# Patient Record
Sex: Female | Born: 1975 | Race: Black or African American | Hispanic: No | Marital: Single | Smoking: Current every day smoker
Health system: Southern US, Community
[De-identification: ages and names within clinical notes are randomized; demographics above are authoritative.]

## PROBLEM LIST (undated history)

## (undated) DIAGNOSIS — J45909 Unspecified asthma, uncomplicated: Secondary | ICD-10-CM

## (undated) HISTORY — PX: CHOLECYSTECTOMY: SHX55

---

## 2014-07-05 ENCOUNTER — Emergency Department: Payer: Self-pay | Admitting: Emergency Medicine

## 2014-07-05 LAB — COMPREHENSIVE METABOLIC PANEL
ALK PHOS: 66 U/L
Albumin: 3.8 g/dL (ref 3.4–5.0)
Anion Gap: 4 — ABNORMAL LOW (ref 7–16)
BUN: 13 mg/dL (ref 7–18)
Bilirubin,Total: <0.1 mg/dL — ABNORMAL LOW (ref 0.2–1.0)
CHLORIDE: 110 mmol/L — AB (ref 98–107)
Calcium, Total: 9.3 mg/dL (ref 8.5–10.1)
Co2: 26 mmol/L (ref 21–32)
Creatinine: 0.99 mg/dL (ref 0.60–1.30)
EGFR (Non-African Amer.): >60
Glucose: 88 mg/dL (ref 65–99)
Osmolality: 279 (ref 275–301)
Potassium: 4.1 mmol/L (ref 3.5–5.1)
SGOT(AST): 27 U/L (ref 15–37)
SGPT (ALT): 36 U/L
SODIUM: 140 mmol/L (ref 136–145)
TOTAL PROTEIN: 8.6 g/dL — AB (ref 6.4–8.2)

## 2014-07-05 LAB — URINALYSIS, COMPLETE
BILIRUBIN, UR: NEGATIVE
Blood: NEGATIVE
Glucose,UR: NEGATIVE mg/dL (ref 0–75)
Ketone: NEGATIVE
Leukocyte Esterase: NEGATIVE
NITRITE: NEGATIVE
PH: 6 (ref 4.5–8.0)
Protein: NEGATIVE
RBC,UR: 1 /HPF (ref 0–5)
Specific Gravity: 1.015 (ref 1.003–1.030)
Squamous Epithelial: 1

## 2014-07-05 LAB — PREGNANCY, URINE: Pregnancy Test, Urine: NEGATIVE m[IU]/mL

## 2014-07-05 LAB — ED INFLUENZA
H1N1FLUPCR: NOT DETECTED
INFLBPCR: NEGATIVE
Influenza A By PCR: NEGATIVE

## 2014-07-05 LAB — CBC
HCT: 42 % (ref 35.0–47.0)
HGB: 13.7 g/dL (ref 12.0–16.0)
MCH: 27.5 pg (ref 26.0–34.0)
MCHC: 32.6 g/dL (ref 32.0–36.0)
MCV: 84 fL (ref 80–100)
PLATELETS: 332 10*3/uL (ref 150–440)
RBC: 4.98 10*6/uL (ref 3.80–5.20)
RDW: 13.7 % (ref 11.5–14.5)
WBC: 9.3 10*3/uL (ref 3.6–11.0)

## 2015-02-04 ENCOUNTER — Emergency Department
Admission: EM | Admit: 2015-02-04 | Discharge: 2015-02-04 | Disposition: A | Discharge status: Home / Self Care | Payer: Worker's Compensation | Attending: Emergency Medicine | Admitting: Emergency Medicine

## 2015-02-04 ENCOUNTER — Emergency Department: Payer: Worker's Compensation

## 2015-02-04 DIAGNOSIS — W228XXA Striking against or struck by other objects, initial encounter: Secondary | ICD-10-CM | POA: Diagnosis not present

## 2015-02-04 DIAGNOSIS — Y9389 Activity, other specified: Secondary | ICD-10-CM | POA: Diagnosis not present

## 2015-02-04 DIAGNOSIS — S99912A Unspecified injury of left ankle, initial encounter: Secondary | ICD-10-CM | POA: Diagnosis present

## 2015-02-04 DIAGNOSIS — Y990 Civilian activity done for income or pay: Secondary | ICD-10-CM | POA: Insufficient documentation

## 2015-02-04 DIAGNOSIS — Y92219 Unspecified school as the place of occurrence of the external cause: Secondary | ICD-10-CM | POA: Insufficient documentation

## 2015-02-04 DIAGNOSIS — S9002XA Contusion of left ankle, initial encounter: Secondary | ICD-10-CM | POA: Diagnosis not present

## 2015-02-04 MED ORDER — KETOROLAC TROMETHAMINE 30 MG/ML IJ SOLN: 60.0000 mg | Freq: Once | INTRAMUSCULAR | Status: AC

## 2015-02-04 MED ORDER — HYDROCODONE-ACETAMINOPHEN 5-325 MG PO TABS: 1.0000 | ORAL_TABLET | ORAL | Status: DC | PRN

## 2015-02-04 MED ORDER — IBUPROFEN 800 MG PO TABS: 800.0000 mg | ORAL_TABLET | Freq: Three times a day (TID) | ORAL | Status: DC | PRN

## 2015-02-04 MED ORDER — KETOROLAC TROMETHAMINE 60 MG/2ML IM SOLN
INTRAMUSCULAR | Status: AC
  Administered 2015-02-04: 60 mg
  Filled 2015-02-04: qty 2
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
## 2015-02-04 NOTE — ED Provider Notes (Signed)
[facility] Emergency Department Provider Note  ____________________________________________  Time seen: Approximately 10:35 AM  I have reviewed the triage vital signs and the nursing notes.   HISTORY  Chief Complaint Ankle Pain    HPI Kayla Vargas is a 39 y.o. female since emergency room with complaints of left ankle pain and swelling. States that she was working at [employer] 3 days ago when a rack came out and Hit her leg. She complains initially of ankle pain and swelling however reports the swelling has gone down today but still complains of pain. Rates pain as a 10 over 10.   No past medical history on file.  There are no active problems to display for this patient.   No past surgical history on file.  Current Outpatient Rx  Name  Route  Sig  Dispense  Refill  . HYDROcodone-acetaminophen (NORCO) 5-325 MG per tablet   Oral   Take 1 tablet by mouth every 4 (four) hours as needed for moderate pain.   12 tablet   0   . ibuprofen (ADVIL,MOTRIN) 800 MG tablet   Oral   Take 1 tablet (800 mg total) by mouth every 8 (eight) hours as needed.   30 tablet   0     Allergies Review of patient's allergies indicates no known allergies.  No family history on file.  Social History History  Substance Use Topics  . Smoking status: Not on file  . Smokeless tobacco: Not on file  . Alcohol Use: Not on file    Review of Systems Constitutional: No fever/chills Cardiovascular: Denies chest pain. Respiratory: Denies shortness of breath. Musculoskeletal: Negative for back pain. Skin: Negative for rash. Neurological: Negative for headaches, focal weakness or numbness.  10-point ROS otherwise negative.  ____________________________________________   PHYSICAL EXAM:  VITAL SIGNS: ED Triage Vitals  Enc Vitals Group     BP 02/04/15 1009 113/69 mmHg     Pulse Rate 02/04/15 1009 88     Resp 02/04/15 1009 16     Temp 02/04/15 1009 99 F  (37.2 C)     Temp Source 02/04/15 1009 Oral     SpO2 02/04/15 1009 97 %     Weight 02/04/15 1009 249 lb (112.946 kg)     Height 02/04/15 1009  (1.6 m)     Head Cir --      Peak Flow --      Pain Score 02/04/15 1009 10     Pain Loc --      Pain Edu? --      Excl. in GC? --     Constitutional: Alert and oriented. Well appearing and in no acute distress.   Cardiovascular: Normal rate, regular rhythm. Grossly normal heart sounds.  Good peripheral circulation. Respiratory: Normal respiratory effort.  No retractions. Lungs CTAB. Gastrointestinal: Soft and nontender. No distention. No abdominal bruits. No CVA tenderness. Musculoskeletal: No lower extremity tenderness nor edema.  No joint effusions. Neurologic:  Normal speech and language. No gross focal neurologic deficits are appreciated. Speech is normal. Gait not tested due to pain. Skin:  Skin is warm, dry and intact. No rash noted. Psychiatric: Mood and affect are normal. Speech and behavior are normal.  ____________________________________________   LABS (all labs ordered are listed, but only abnormal results are displayed)  Labs Reviewed - No data to display ____________________________________________  EKG not applicable ____________________________________________  RADIOLOGY Interpreted by radiologist, reviewed by myself. Negative for fracture. ____________________________________________   PROCEDURES  Procedure(s) performed:  None  Critical Care performed: No  ____________________________________________   INITIAL IMPRESSION / ASSESSMENT AND PLAN / ED COURSE  Pertinent labs & imaging results that were available during my care of the patient were reviewed by me and considered in my medical decision making (see chart for details).  Nicole Kindredgnes is acute right ankle contusion. Rx given for Motrin 800 mg 3 times a day #30, follow up with PCP as needed. ____________________________________________   FINAL CLINICAL  IMPRESSION(S) / ED DIAGNOSES  Final diagnoses:  Ankle contusion, left, initial encounter      Evangeline DakinCharles M Beers, PA-C 02/04/15 1130  Jene Everyobert Kinner, MD 02/04/15 [phone]

## 2015-02-04 NOTE — Discharge Instructions (Signed)
Contusion °A contusion is a deep bruise. Contusions are the result of an injury that caused bleeding under the skin. The contusion may turn blue, purple, or yellow. Minor injuries will give you a painless contusion, but more severe contusions may stay painful and swollen for a few weeks.  °CAUSES  °A contusion is usually caused by a blow, trauma, or direct force to an area of the body. °SYMPTOMS  °· Swelling and redness of the injured area. °· Bruising of the injured area. °· Tenderness and soreness of the injured area. °· Pain. °DIAGNOSIS  °The diagnosis can be made by taking a history and physical exam. An X-ray, CT scan, or MRI may be needed to determine if there were any associated injuries, such as fractures. °TREATMENT  °Specific treatment will depend on what area of the body was injured. In general, the best treatment for a contusion is resting, icing, elevating, and applying cold compresses to the injured area. Over-the-counter medicines may also be recommended for pain control. Ask your caregiver what the best treatment is for your contusion. °HOME CARE INSTRUCTIONS  °· Put ice on the injured area. °¨ Put ice in a plastic bag. °¨ Place a towel between your skin and the bag. °¨ Leave the ice on for 15-20 minutes, 3-4 times a day, or as directed by your health care provider. °· Only take over-the-counter or prescription medicines for pain, discomfort, or fever as directed by your caregiver. Your caregiver may recommend avoiding anti-inflammatory medicines (aspirin, ibuprofen, and naproxen) for 48 hours because these medicines may increase bruising. °· Rest the injured area. °· If possible, elevate the injured area to reduce swelling. °SEEK IMMEDIATE MEDICAL CARE IF:  °· You have increased bruising or swelling. °· You have pain that is getting worse. °· Your swelling or pain is not relieved with medicines. °MAKE SURE YOU:  °· Understand these instructions. °· Will watch your condition. °· Will get help right  away if you are not doing well or get worse. °Document Released: 06/11/2005 Document Revised: 09/06/2013 Document Reviewed: 07/07/2011 °ExitCare® Patient Information ©2015 [employer]. This information is not intended to replace advice given to you by your health care provider. Make sure you discuss any questions you have with your health care provider. ° °

## 2015-07-04 ENCOUNTER — Emergency Department: Payer: Self-pay

## 2015-07-04 ENCOUNTER — Encounter: Payer: Self-pay | Admitting: *Deleted

## 2015-07-04 ENCOUNTER — Emergency Department
Admission: EM | Admit: 2015-07-04 | Discharge: 2015-07-04 | Disposition: A | Discharge status: Home / Self Care | Payer: Self-pay | Attending: Emergency Medicine | Admitting: Emergency Medicine

## 2015-07-04 DIAGNOSIS — Y92410 Unspecified street and highway as the place of occurrence of the external cause: Secondary | ICD-10-CM | POA: Insufficient documentation

## 2015-07-04 DIAGNOSIS — Y9389 Activity, other specified: Secondary | ICD-10-CM | POA: Insufficient documentation

## 2015-07-04 DIAGNOSIS — S39012A Strain of muscle, fascia and tendon of lower back, initial encounter: Secondary | ICD-10-CM | POA: Insufficient documentation

## 2015-07-04 DIAGNOSIS — Y998 Other external cause status: Secondary | ICD-10-CM | POA: Insufficient documentation

## 2015-07-04 DIAGNOSIS — S4991XA Unspecified injury of right shoulder and upper arm, initial encounter: Secondary | ICD-10-CM | POA: Insufficient documentation

## 2015-07-04 DIAGNOSIS — S0990XA Unspecified injury of head, initial encounter: Secondary | ICD-10-CM | POA: Insufficient documentation

## 2015-07-04 DIAGNOSIS — Z720 Tobacco use: Secondary | ICD-10-CM | POA: Insufficient documentation

## 2015-07-04 DIAGNOSIS — S161XXA Strain of muscle, fascia and tendon at neck level, initial encounter: Secondary | ICD-10-CM | POA: Insufficient documentation

## 2015-07-04 HISTORY — DX: Unspecified asthma, uncomplicated: J45.909

## 2015-07-04 MED ORDER — TRAMADOL HCL 50 MG PO TABS: 50.0000 mg | ORAL_TABLET | Freq: Once | ORAL | Status: DC

## 2015-07-04 MED ORDER — IBUPROFEN 600 MG PO TABS: 600.0000 mg | ORAL_TABLET | Freq: Four times a day (QID) | ORAL | Status: DC | PRN

## 2015-07-04 MED ORDER — TRAMADOL HCL 50 MG PO TABS
50.0000 mg | ORAL_TABLET | Freq: Once | ORAL | Status: AC
  Administered 2015-07-04: 50 mg via ORAL
  Filled 2015-07-04: qty 1

## 2015-07-04 MED ORDER — IBUPROFEN 600 MG PO TABS
600.0000 mg | ORAL_TABLET | Freq: Once | ORAL | Status: AC
  Administered 2015-07-04: 600 mg via ORAL
  Filled 2015-07-04: qty 1

## 2015-07-04 NOTE — ED Notes (Signed)
Patient discharged to home per MD order. Patient in stable condition, and deemed medically cleared by ED provider for discharge. Discharge instructions reviewed with patient/family using "Teach Back"; verbalized understanding of medication education and administration, and information about follow-up care. Denies further concerns. ° °
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
## 2015-07-04 NOTE — ED Notes (Signed)
Patient transported to X-ray 

## 2015-07-04 NOTE — Discharge Instructions (Signed)
Acute Torticollis °Torticollis is a condition in which the muscles of the neck tighten (contract) abnormally, causing the neck to twist and the head to move into an unnatural position. Torticollis that develops suddenly is called acute torticollis. If torticollis becomes chronic and is left untreated, the face and neck can become deformed. °CAUSES °This condition may be caused by: °· Sleeping in an awkward position (common). °· Extending or twisting the neck muscles beyond their normal position. °· Infection. °In some cases, the cause may not be known. °SYMPTOMS °Symptoms of this condition include: °· An unnatural position of the head. °· Neck pain. °· A limited ability to move the neck. °· Twisting of the neck to one side. °DIAGNOSIS °This condition is diagnosed with a physical exam. You may also have imaging tests, such as an X-ray, CT scan, or MRI. °TREATMENT °Treatment for this condition involves trying to relax the neck muscles. It may include: °· Medicines or shots. °· Physical therapy. °· Surgery. This may be done in severe cases. °HOME CARE INSTRUCTIONS °· Take medicines only as directed by your health care provider. °· Do stretching exercises and massage your neck as directed by your health care provider. °· Keep all follow-up visits as directed by your health care provider. This is important. °SEEK MEDICAL CARE IF: °· You develop a fever. °SEEK IMMEDIATE MEDICAL CARE IF: °· You develop difficulty breathing. °· You develop noisy breathing (stridor). °· You start drooling. °· You have trouble swallowing or have pain with swallowing. °· You develop numbness or weakness in your hands or feet. °· You have changes in your speech, understanding, or vision. °· Your pain gets worse. °  °This information is not intended to replace advice given to you by your health care provider. Make sure you discuss any questions you have with your health care provider. °  °Document Released: 08/29/2000 Document Revised:  01/16/2015 Document Reviewed: 08/28/2014 °Elsevier Interactive Patient Education ©2016 [employer] ° °

## 2015-07-12 ENCOUNTER — Emergency Department
Admission: EM | Admit: 2015-07-12 | Discharge: 2015-07-12 | Disposition: A | Discharge status: Home / Self Care | Payer: Self-pay | Attending: Emergency Medicine | Admitting: Emergency Medicine

## 2015-07-12 ENCOUNTER — Encounter: Payer: Self-pay | Admitting: *Deleted

## 2015-07-12 DIAGNOSIS — M62830 Muscle spasm of back: Secondary | ICD-10-CM | POA: Insufficient documentation

## 2015-07-12 DIAGNOSIS — Z720 Tobacco use: Secondary | ICD-10-CM | POA: Insufficient documentation

## 2015-07-12 DIAGNOSIS — Z79899 Other long term (current) drug therapy: Secondary | ICD-10-CM | POA: Insufficient documentation

## 2015-07-12 DIAGNOSIS — M5431 Sciatica, right side: Secondary | ICD-10-CM | POA: Insufficient documentation

## 2015-07-12 DIAGNOSIS — M5432 Sciatica, left side: Secondary | ICD-10-CM | POA: Insufficient documentation

## 2015-07-12 MED ORDER — CYCLOBENZAPRINE HCL 10 MG PO TABS: 10.0000 mg | ORAL_TABLET | Freq: Three times a day (TID) | ORAL | Status: DC | PRN

## 2015-07-12 MED ORDER — KETOROLAC TROMETHAMINE 30 MG/ML IJ SOLN
60.0000 mg | Freq: Once | INTRAMUSCULAR | Status: AC
  Administered 2015-07-12: 60 mg via INTRAMUSCULAR
  Filled 2015-07-12: qty 2

## 2015-07-12 MED ORDER — MELOXICAM 15 MG PO TABS: 15.0000 mg | ORAL_TABLET | Freq: Every day | ORAL | Status: DC
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
## 2015-07-12 NOTE — Discharge Instructions (Signed)
Back Exercises °The following exercises strengthen the muscles that help to support the back. They also help to keep the lower back flexible. Doing these exercises can help to prevent back pain or lessen existing pain. °If you have back pain or discomfort, try doing these exercises 2-3 times each day or as told by your health care provider. When the pain goes away, do them once each day, but increase the number of times that you repeat the steps for each exercise (do more repetitions). If you do not have back pain or discomfort, do these exercises once each day or as told by your health care provider. °EXERCISES °Single Knee to Chest °Repeat these steps 3-5 times for each leg: °· Lie on your back on a firm bed or the floor with your legs extended. °· Bring one knee to your chest. Your other leg should stay extended and in contact with the floor. °· Hold your knee in place by grabbing your knee or thigh. °· Pull on your knee until you feel a gentle stretch in your lower back. °· Hold the stretch for 10-30 seconds. °· Slowly release and straighten your leg. °Pelvic Tilt °Repeat these steps 5-10 times: °· Lie on your back on a firm bed or the floor with your legs extended. °· Bend your knees so they are pointing toward the ceiling and your feet are flat on the floor. °· Tighten your lower abdominal muscles to press your lower back against the floor. This motion will tilt your pelvis so your tailbone points up toward the ceiling instead of pointing to your feet or the floor. °· With gentle tension and even breathing, hold this position for 5-10 seconds. °Cat-Cow °Repeat these steps until your lower back becomes more flexible: °· Get into a hands-and-knees position on a firm surface. Keep your hands under your shoulders, and keep your knees under your hips. You may place padding under your knees for comfort. °· Let your head hang down, and point your tailbone toward the floor so your lower back becomes rounded like the  back of a cat. °· Hold this position for 5 seconds. °· Slowly lift your head and point your tailbone up toward the ceiling so your back forms a sagging arch like the back of a cow. °· Hold this position for 5 seconds. °Press-Ups °Repeat these steps 5-10 times: °· Lie on your abdomen (face-down) on the floor. °· Place your palms near your head, about shoulder-width apart. °· While you keep your back as relaxed as possible and keep your hips on the floor, slowly straighten your arms to raise the top half of your body and lift your shoulders. Do not use your back muscles to raise your upper torso. You may adjust the placement of your hands to make yourself more comfortable. °· Hold this position for 5 seconds while you keep your back relaxed. °· Slowly return to lying flat on the floor. °Bridges °Repeat these steps 10 times: °· Lie on your back on a firm surface. °· Bend your knees so they are pointing toward the ceiling and your feet are flat on the floor. °· Tighten your buttocks muscles and lift your buttocks off of the floor until your waist is at almost the same height as your knees. You should feel the muscles working in your buttocks and the back of your thighs. If you do not feel these muscles, slide your feet 1-2 inches farther away from your buttocks. °· Hold this position for 3-5   seconds.  Slowly lower your hips to the starting position, and allow your buttocks muscles to relax completely. If this exercise is too easy, try doing it with your arms crossed over your chest. Abdominal Crunches Repeat these steps 5-10 times: 1. Lie on your back on a firm bed or the floor with your legs extended. 2. Bend your knees so they are pointing toward the ceiling and your feet are flat on the floor. 3. Cross your arms over your chest. 4. Tip your chin slightly toward your chest without bending your neck. 5. Tighten your abdominal muscles and slowly raise your trunk (torso) high enough to lift your shoulder blades  a tiny bit off of the floor. Avoid raising your torso higher than that, because it can put too much stress on your low back and it does not help to strengthen your abdominal muscles. 6. Slowly return to your starting position. Back Lifts Repeat these steps 5-10 times: 1. Lie on your abdomen (face-down) with your arms at your sides, and rest your forehead on the floor. 2. Tighten the muscles in your legs and your buttocks. 3. Slowly lift your chest off of the floor while you keep your hips pressed to the floor. Keep the back of your head in line with the curve in your back. Your eyes should be looking at the floor. 4. Hold this position for 3-5 seconds. 5. Slowly return to your starting position. SEEK MEDICAL CARE IF:  Your back pain or discomfort gets much worse when you do an exercise.  Your back pain or discomfort does not lessen within 2 hours after you exercise. If you have any of these problems, stop doing these exercises right away. Do not do them again unless your health care provider says that you can. SEEK IMMEDIATE MEDICAL CARE IF:  You develop sudden, severe back pain. If this happens, stop doing the exercises right away. Do not do them again unless your health care provider says that you can.   This information is not intended to replace advice given to you by your health care provider. Make sure you discuss any questions you have with your health care provider.   Document Released: 10/09/2004 Document Revised: 05/23/2015 Document Reviewed: 10/26/2014 Elsevier Interactive Patient Education 2016 [place] therapy can help ease sore, stiff, injured, and tight muscles and joints. Heat relaxes your muscles, which may help ease your pain.  RISKS AND COMPLICATIONS If you have any of the following conditions, do not use heat therapy unless your health care provider has approved:  Poor circulation.  Healing wounds or scarred skin in the area being  treated.  Diabetes, heart disease, or high blood pressure.  Not being able to feel (numbness) the area being treated.  Unusual swelling of the area being treated.  Active infections.  Blood clots.  Cancer.  Inability to communicate pain. This may include young children and people who have problems with their brain function (dementia).  Pregnancy. Heat therapy should only be used on old, pre-existing, or long-lasting (chronic) injuries. Do not use heat therapy on new injuries unless directed by your health care provider. HOW TO USE HEAT THERAPY There are several different kinds of heat therapy, including:  Moist heat pack.  Warm water bath.  Hot water bottle.  Electric heating pad.  Heated gel pack.  Heated wrap.  Electric heating pad. Use the heat therapy method suggested by your health care provider. Follow your health care provider's instructions on when  and how to use heat therapy. GENERAL HEAT THERAPY RECOMMENDATIONS  Do not sleep while using heat therapy. Only use heat therapy while you are awake.  Your skin may turn pink while using heat therapy. Do not use heat therapy if your skin turns red.  Do not use heat therapy if you have new pain.  High heat or long exposure to heat can cause burns. Be careful when using heat therapy to avoid burning your skin.  Do not use heat therapy on areas of your skin that are already irritated, such as with a rash or sunburn. SEEK MEDICAL CARE IF:  You have blisters, redness, swelling, or numbness.  You have new pain.  Your pain is worse. MAKE SURE YOU:  Understand these instructions.  Will watch your condition.  Will get help right away if you are not doing well or get worse.   This information is not intended to replace advice given to you by your health care provider. Make sure you discuss any questions you have with your health care provider.   Document Released: 11/24/2011 Document Revised: 09/22/2014 Document  Reviewed: 10/25/2013 Elsevier Interactive Patient Education 2016 [employer] Inc[employer]  Muscle Cramps and Spasms Muscle cramps and spasms are when muscles tighten by themselves. They usually get better within minutes. Muscle cramps are painful. They are usually stronger and last longer than muscle spasms. Muscle spasms may or may not be painful. They can last a few seconds or much longer. HOME CARE  Drink enough fluid to keep your pee (urine) clear or pale yellow.  Massage, stretch, and relax the muscle.  Use a warm towel, heating pad, or warm shower water on tight muscles.  Place ice on the muscle if it is tender or in pain.  Put ice in a plastic bag.  Place a towel between your skin and the bag.  Leave the ice on for 15-20 minutes, 03-04 times a day.  Only take medicine as told by your doctor. GET HELP RIGHT AWAY IF:  Your cramps or spasms get worse, happen more often, or do not get better with time. MAKE SURE YOU:  Understand these instructions.  Will watch your condition.  Will get help right away if you are not doing well or get worse.   This information is not intended to replace advice given to you by your health care provider. Make sure you discuss any questions you have with your health care provider.   Document Released: 08/14/2008 Document Revised: 12/27/2012 Document Reviewed: 08/18/2012 Elsevier Interactive Patient Education [employer]
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
## 2015-09-25 ENCOUNTER — Emergency Department
Admission: EM | Admit: 2015-09-25 | Discharge: 2015-09-25 | Disposition: A | Discharge status: Home / Self Care | Payer: Managed Care, Other (non HMO) | Attending: Emergency Medicine | Admitting: Emergency Medicine

## 2015-09-25 ENCOUNTER — Emergency Department: Payer: Managed Care, Other (non HMO)

## 2015-09-25 DIAGNOSIS — F17210 Nicotine dependence, cigarettes, uncomplicated: Secondary | ICD-10-CM | POA: Diagnosis not present

## 2015-09-25 DIAGNOSIS — M25461 Effusion, right knee: Secondary | ICD-10-CM | POA: Diagnosis not present

## 2015-09-25 DIAGNOSIS — Y9289 Other specified places as the place of occurrence of the external cause: Secondary | ICD-10-CM | POA: Insufficient documentation

## 2015-09-25 DIAGNOSIS — S8991XA Unspecified injury of right lower leg, initial encounter: Secondary | ICD-10-CM | POA: Diagnosis present

## 2015-09-25 DIAGNOSIS — X58XXXA Exposure to other specified factors, initial encounter: Secondary | ICD-10-CM | POA: Insufficient documentation

## 2015-09-25 DIAGNOSIS — S8001XA Contusion of right knee, initial encounter: Secondary | ICD-10-CM | POA: Insufficient documentation

## 2015-09-25 DIAGNOSIS — Y998 Other external cause status: Secondary | ICD-10-CM | POA: Diagnosis not present

## 2015-09-25 DIAGNOSIS — Y9389 Activity, other specified: Secondary | ICD-10-CM | POA: Insufficient documentation

## 2015-09-25 MED ORDER — OXYCODONE-ACETAMINOPHEN 5-325 MG PO TABS: 1.0000 | ORAL_TABLET | ORAL | Status: AC | PRN

## 2015-09-25 MED ORDER — IBUPROFEN 800 MG PO TABS: 800.0000 mg | ORAL_TABLET | Freq: Three times a day (TID) | ORAL | Status: AC | PRN

## 2015-09-25 NOTE — ED Notes (Signed)
Pt states she injured her right knee playing in the snow with her kids on Saturday

## 2015-09-25 NOTE — ED Notes (Signed)
Fell on knee this weekend while playing in the the snow  conts to have pain with ambulation

## 2015-09-25 NOTE — Discharge Instructions (Signed)
Heat Therapy Heat therapy can help ease sore, stiff, injured, and tight muscles and joints. Heat relaxes your muscles, which may help ease your pain.  RISKS AND COMPLICATIONS If you have any of the following conditions, do not use heat therapy unless your health care provider has approved:  Poor circulation.  Healing wounds or scarred skin in the area being treated.  Diabetes, heart disease, or high blood pressure.  Not being able to feel (numbness) the area being treated.  Unusual swelling of the area being treated.  Active infections.  Blood clots.  Cancer.  Inability to communicate pain. This may include young children and people who have problems with their brain function (dementia).  Pregnancy. Heat therapy should only be used on old, pre-existing, or long-lasting (chronic) injuries. Do not use heat therapy on new injuries unless directed by your health care provider. HOW TO USE HEAT THERAPY There are several different kinds of heat therapy, including:  Moist heat pack.  Warm water bath.  Hot water bottle.  Electric heating pad.  Heated gel pack.  Heated wrap.  Electric heating pad. Use the heat therapy method suggested by your health care provider. Follow your health care provider's instructions on when and how to use heat therapy. GENERAL HEAT THERAPY RECOMMENDATIONS  Do not sleep while using heat therapy. Only use heat therapy while you are awake.  Your skin may turn pink while using heat therapy. Do not use heat therapy if your skin turns red.  Do not use heat therapy if you have new pain.  High heat or long exposure to heat can cause burns. Be careful when using heat therapy to avoid burning your skin.  Do not use heat therapy on areas of your skin that are already irritated, such as with a rash or sunburn. SEEK MEDICAL CARE IF:  You have blisters, redness, swelling, or numbness.  You have new pain.  Your pain is worse. MAKE SURE  YOU:  Understand these instructions.  Will watch your condition.  Will get help right away if you are not doing well or get worse.   This information is not intended to replace advice given to you by your health care provider. Make sure you discuss any questions you have with your health care provider.   Document Released: 11/24/2011 Document Revised: 09/22/2014 Document Reviewed: 10/25/2013 Elsevier Interactive Patient Education 2016 Else[employer] Inc.  Cryotherapy Cryotherapy means treatment with cold. Ice or gel packs can be used to reduce both pain and swelling. Ice is the most helpful within the first 24 to 48 hours after an injury or flare-up from overusing a muscle or joint. Sprains, strains, spasms, burning pain, shooting pain, and aches can all be eased with ice. Ice can also be used when recovering from surgery. Ice is effective, has very few side effects, and is safe for most people to use. PRECAUTIONS  Ice is not a safe treatment option for people with:  Raynaud phenomenon. This is a condition affecting small blood vessels in the extremities. Exposure to cold may cause your problems to return.  Cold hypersensitivity. There are many forms of cold hypersensitivity, including:  Cold urticaria. Red, itchy hives appear on the skin when the tissues begin to warm after being iced.  Cold erythema. This is a red, itchy rash caused by exposure to cold.  Cold hemoglobinuria. Red blood cells break down when the tissues begin to warm after being iced. The hemoglobin that carry oxygen are passed into the urine because they cannot  combine with blood proteins fast enough.  Numbness or altered sensitivity in the area being iced. If you have any of the following conditions, do not use ice until you have discussed cryotherapy with your caregiver:  Heart conditions, such as arrhythmia, angina, or chronic heart disease.  High blood pressure.  Healing wounds or open skin in the area being  iced.  Current infections.  Rheumatoid arthritis.  Poor circulation.  Diabetes. Ice slows the blood flow in the region it is applied. This is beneficial when trying to stop inflamed tissues from spreading irritating chemicals to surrounding tissues. However, if you expose your skin to cold temperatures for too long or without the proper protection, you can damage your skin or nerves. Watch for signs of skin damage due to cold. HOME CARE INSTRUCTIONS Follow these tips to use ice and cold packs safely.  Place a dry or damp towel between the ice and skin. A damp towel will cool the skin more quickly, so you may need to shorten the time that the ice is used.  For a more rapid response, add gentle compression to the ice.  Ice for no more than 10 to 20 minutes at a time. The bonier the area you are icing, the less time it will take to get the benefits of ice.  Check your skin after 5 minutes to make sure there are no signs of a poor response to cold or skin damage.  Rest 20 minutes or more between uses.  Once your skin is numb, you can end your treatment. You can test numbness by very lightly touching your skin. The touch should be so light that you do not see the skin dimple from the pressure of your fingertip. When using ice, most people will feel these normal sensations in this order: cold, burning, aching, and numbness.  Do not use ice on someone who cannot communicate their responses to pain, such as small children or people with dementia. HOW TO MAKE AN ICE PACK Ice packs are the most common way to use ice therapy. Other methods include ice massage, ice baths, and cryosprays. Muscle creams that cause a cold, tingly feeling do not offer the same benefits that ice offers and should not be used as a substitute unless recommended by your caregiver. To make an ice pack, do one of the following:  Place crushed ice or a bag of frozen vegetables in a sealable plastic bag. Squeeze out the excess  air. Place this bag inside another plastic bag. Slide the bag into a pillowcase or place a damp towel between your skin and the bag.  Mix 3 parts water with 1 part rubbing alcohol. Freeze the mixture in a sealable plastic bag. When you remove the mixture from the freezer, it will be slushy. Squeeze out the excess air. Place this bag inside another plastic bag. Slide the bag into a pillowcase or place a damp towel between your skin and the bag. SEEK MEDICAL CARE IF:  You develop white spots on your skin. This may give the skin a blotchy (mottled) appearance.  Your skin turns blue or pale.  Your skin becomes waxy or hard.  Your swelling gets worse. MAKE SURE YOU:   Understand these instructions.  Will watch your condition.  Will get help right away if you are not doing well or get worse.   This information is not intended to replace advice given to you by your health care provider. Make sure you discuss any questions you  have with your health care provider.   Document Released: 04/28/2011 Document Revised: 09/22/2014 Document Reviewed: 04/28/2011 Elsevier Interactive Patient Education 2016 [employer].  Knee Effusion Knee effusion means that you have excess fluid in your knee joint. This can cause pain and swelling in your knee. This may make your knee more difficult to bend and move. That is because there is increased pain and pressure in the joint. If there is fluid in your knee, it often means that something is wrong inside your knee, such as severe arthritis, abnormal inflammation, or an infection. Another common cause of knee effusion is an injury to the knee muscles, ligaments, or cartilage. HOME CARE INSTRUCTIONS  Use crutches as directed by your health care provider.  Wear a knee brace as directed by your health care provider.  Apply ice to the swollen area:  Put ice in a plastic bag.  Place a towel between your skin and the bag.  Leave the ice on for 20 minutes, 2-3  times per day.  Keep your knee raised (elevated) when you are sitting or lying down.  Take medicines only as directed by your health care provider.  Do any rehabilitation or strengthening exercises as directed by your health care provider.  Rest your knee as directed by your health care provider. You may start doing your normal activities again when your health care provider approves.   Keep all follow-up visits as directed by your health care provider. This is important. SEEK MEDICAL CARE IF:  You have ongoing (persistent) pain in your knee. SEEK IMMEDIATE MEDICAL CARE IF:  You have increased swelling or redness of your knee.  You have severe pain in your knee.  You have a fever.   This information is not intended to replace advice given to you by your health care provider. Make sure you discuss any questions you have with your health care provider.   Document Released: 11/22/2003 Document Revised: 09/22/2014 Document Reviewed: 04/17/2014 Elsevier Interactive Patient Education [employer] Inc[employer]

## 2015-09-25 NOTE — ED Provider Notes (Signed)
[facility] Emergency Department Provider Note  ____________________________________________  Time seen: Approximately 3:08 PM  I have reviewed the triage vital signs and the nursing notes.   HISTORY  Chief Complaint Knee Pain   HPI Kayla Vargas is a 40 y.o. female presents with complaints of injuring right knee. Patient reports that she injured it playing in the snow continues to have pain with ambulation.   Past Medical History  Diagnosis Date  . Asthma     There are no active problems to display for this patient.   History reviewed. No pertinent past surgical history.  Current Outpatient Rx  Name  Route  Sig  Dispense  Refill  . ibuprofen (ADVIL,MOTRIN) 800 MG tablet   Oral   Take 1 tablet (800 mg total) by mouth every 8 (eight) hours as needed.   30 tablet   0   . oxyCODONE-acetaminophen (ROXICET) 5-325 MG tablet   Oral   Take 1-2 tablets by mouth every 4 (four) hours as needed for severe pain.   15 tablet   0     Allergies Review of patient's allergies indicates no known allergies.  No family history on file.  Social History Social History  Substance Use Topics  . Smoking status: Current Every Day Smoker    Types: Cigarettes  . Smokeless tobacco: None  . Alcohol Use: No    Review of Systems Constitutional: No fever/chills Eyes: No visual changes. ENT: No sore throat. Cardiovascular: Denies chest pain. Respiratory: Denies shortness of breath. Gastrointestinal: No abdominal pain.  No nausea, no vomiting.  No diarrhea.  No constipation. Genitourinary: Negative for dysuria. Musculoskeletal: Positive for right knee pain. Skin: Negative for rash. Neurological: Negative for headaches, focal weakness or numbness.  10-point ROS otherwise negative.  ____________________________________________   PHYSICAL EXAM:  VITAL SIGNS: ED Triage Vitals  Enc Vitals Group     BP 09/25/15 1338 106/89 mmHg     Pulse Rate 09/25/15  1338 67     Resp 09/25/15 1338 18     Temp 09/25/15 1338 98.4 F (36.9 C)     Temp Source 09/25/15 1338 Oral     SpO2 09/25/15 1338 99 %     Weight 09/25/15 1338 250 lb (113.399 kg)     Height 09/25/15 1338 5\' 3"  (1.6 m)     Head Cir --      Peak Flow --      Pain Score 09/25/15 1342 7     Pain Loc --      Pain Edu? --      Excl. in GC? --     Constitutional: Alert and oriented. Well appearing and in no acute distress. Cardiovascular: Normal rate, regular rhythm. Grossly normal heart sounds.  Good peripheral circulation. Respiratory: Normal respiratory effort.  No retractions. Lungs CTAB. Musculoskeletal: No lower extremity tenderness nor edema.  No joint effusions. Neurologic:  Normal speech and language. No gross focal neurologic deficits are appreciated. No gait instability. Skin:  Skin is warm, dry and intact. No rash noted. Psychiatric: Mood and affect are normal. Speech and behavior are normal.  ____________________________________________   LABS (all labs ordered are listed, but only abnormal results are displayed)  Labs Reviewed - No data to display ____________________________________________  RADIOLOGY  Positive for right knee effusion negative fat pad sign or any other acute osseous injury. ____________________________________________   PROCEDURES  Procedure(s) performed: None  Critical Care performed: No  ____________________________________________   INITIAL IMPRESSION / ASSESSMENT AND PLAN / ED  COURSE  Pertinent labs & imaging results that were available during my care of the patient were reviewed by me and considered in my medical decision making (see chart for details).  Right knee contusion with effusion noted. Rx given for Percocet 5/325 Motrin 800 mg. Patient encouraged to keep her leg elevated and ice as needed. Work excuse 2 days. Patient to return to the ER with any worsening symptomology.  Patient voices no other emergency medical  complaints at this visit. ____________________________________________   FINAL CLINICAL IMPRESSION(S) / ED DIAGNOSES  Final diagnoses:  Knee contusion, right, initial encounter  Knee effusion, right      Evangeline Dakinharles M Stefanie Hodgens, PA-C 09/25/15 1523  Rockne MenghiniAnne-Caroline Norman, MD 09/26/15 0007

## 2017-11-14 ENCOUNTER — Other Ambulatory Visit: Payer: Self-pay

## 2017-11-14 ENCOUNTER — Encounter: Payer: Self-pay | Admitting: Emergency Medicine

## 2017-11-14 ENCOUNTER — Emergency Department
Admission: EM | Admit: 2017-11-14 | Discharge: 2017-11-14 | Disposition: A | Discharge status: Home / Self Care | Payer: Managed Care, Other (non HMO) | Attending: Emergency Medicine | Admitting: Emergency Medicine

## 2017-11-14 ENCOUNTER — Emergency Department: Payer: Managed Care, Other (non HMO)

## 2017-11-14 DIAGNOSIS — J069 Acute upper respiratory infection, unspecified: Secondary | ICD-10-CM | POA: Insufficient documentation

## 2017-11-14 DIAGNOSIS — Z79899 Other long term (current) drug therapy: Secondary | ICD-10-CM | POA: Insufficient documentation

## 2017-11-14 DIAGNOSIS — R0789 Other chest pain: Secondary | ICD-10-CM | POA: Diagnosis not present

## 2017-11-14 DIAGNOSIS — J45901 Unspecified asthma with (acute) exacerbation: Secondary | ICD-10-CM | POA: Insufficient documentation

## 2017-11-14 DIAGNOSIS — R05 Cough: Secondary | ICD-10-CM | POA: Diagnosis present

## 2017-11-14 DIAGNOSIS — F17210 Nicotine dependence, cigarettes, uncomplicated: Secondary | ICD-10-CM | POA: Insufficient documentation

## 2017-11-14 LAB — CBC
HCT: 39.8 % (ref 35.0–47.0)
Hemoglobin: 13.3 g/dL (ref 12.0–16.0)
MCH: 27.3 pg (ref 26.0–34.0)
MCHC: 33.4 g/dL (ref 32.0–36.0)
MCV: 81.6 fL (ref 80.0–100.0)
Platelets: 393 10*3/uL (ref 150–440)
RBC: 4.88 MIL/uL (ref 3.80–5.20)
RDW: 13.6 % (ref 11.5–14.5)
WBC: 7.6 10*3/uL (ref 3.6–11.0)

## 2017-11-14 LAB — BASIC METABOLIC PANEL
Anion gap: 9 (ref 5–15)
BUN: 10 mg/dL (ref 6–20)
CO2: 20 mmol/L — ABNORMAL LOW (ref 22–32)
Calcium: 9 mg/dL (ref 8.9–10.3)
Chloride: 108 mmol/L (ref 101–111)
Creatinine, Ser: 0.76 mg/dL (ref 0.44–1.00)
GFR calc Af Amer: >60 mL/min (ref >60)
GLUCOSE: 105 mg/dL — AB (ref 65–99)
Potassium: 4.2 mmol/L (ref 3.5–5.1)
Sodium: 137 mmol/L (ref 135–145)

## 2017-11-14 LAB — TROPONIN I: Troponin I: <0.03 ng/mL (ref <0.03)

## 2017-11-14 LAB — POC URINE PREG, ED: PREG TEST UR: NEGATIVE

## 2017-11-14 MED ORDER — ALBUTEROL SULFATE HFA 108 (90 BASE) MCG/ACT IN AERS: 2.0000 | INHALATION_SPRAY | Freq: Four times a day (QID) | RESPIRATORY_TRACT | 0 refills | Status: AC | PRN

## 2017-11-14 MED ORDER — ALBUTEROL SULFATE (2.5 MG/3ML) 0.083% IN NEBU
5.0000 mg | INHALATION_SOLUTION | Freq: Once | RESPIRATORY_TRACT | Status: AC
  Administered 2017-11-14: 5 mg via RESPIRATORY_TRACT
  Filled 2017-11-14: qty 6

## 2017-11-14 MED ORDER — KETOROLAC TROMETHAMINE 60 MG/2ML IM SOLN
30.0000 mg | Freq: Once | INTRAMUSCULAR | Status: AC
  Administered 2017-11-14: 30 mg via INTRAMUSCULAR
  Filled 2017-11-14: qty 2

## 2017-11-14 NOTE — ED Triage Notes (Signed)
Pt to ED c/o cough x5 days getting worse, coughing up green sputum, states some SOB hx of asthma but no need for inhaler at home.  States chest pain as well worse with the cough to lower right and left chest.  Denies flu shot this year.  A&Ox4, skin warm and dry, chest rise even and unlabored.

## 2017-11-14 NOTE — ED Provider Notes (Signed)
[facility] Emergency Department Provider Note  ___________________________________________   First MD Initiated Contact with Patient 11/14/17 1326     (approximate)  I have reviewed the triage vital signs and the nursing notes.   HISTORY  Chief Complaint Cough; Chest Pain; and Chills   HPI Kayla Vargas is a 42 y.o. female with a history of asthma who is presenting to the emergency department today with a sharp pain across her lower chest.  She says over the past 5 days that she has been having a cough, runny nose as well as ear pressure and throat pain.  Says that she has had chills.  Does not report fever.  No known sick contacts.  Does not use an inhaler at home.  Past Medical History:  Diagnosis Date  . Asthma     There are no active problems to display for this patient.   Past Surgical History:  Procedure Laterality Date  . CHOLECYSTECTOMY      Prior to Admission medications   Medication Sig Start Date End Date Taking? Authorizing Provider  ibuprofen (ADVIL,MOTRIN) 800 MG tablet Take 1 tablet (800 mg total) by mouth every 8 (eight) hours as needed. 09/25/15   Beers, Charmayne Sheer, PA-C  oxyCODONE-acetaminophen (ROXICET) 5-325 MG tablet Take 1-2 tablets by mouth every 4 (four) hours as needed for severe pain. 09/25/15   Beers, Charmayne Sheer, PA-C    Allergies Patient has no known allergies.  History reviewed. No pertinent family history.  Social History Social History   Tobacco Use  . Smoking status: Current Every Day Smoker    Packs/day: 0.50    Types: Cigarettes  . Smokeless tobacco: Never Used  Substance Use Topics  . Alcohol use: No  . Drug use: No    Review of Systems  Constitutional: No fever/chills Eyes: No visual changes. ENT: No sore throat. Cardiovascular: as above Respiratory:  As above Gastrointestinal: No abdominal pain.  No nausea, no vomiting.  No diarrhea.  No constipation. Genitourinary: Negative for  dysuria. Musculoskeletal: Negative for back pain. Skin: Negative for rash. Neurological: Negative for headaches, focal weakness or numbness.   ____________________________________________   PHYSICAL EXAM:  VITAL SIGNS: ED Triage Vitals  Enc Vitals Group     BP 11/14/17 1123 (!) 113/99     Pulse Rate 11/14/17 1123 85     Resp 11/14/17 1123 18     Temp 11/14/17 1123 98.5 F (36.9 C)     Temp Source 11/14/17 1123 Oral     SpO2 11/14/17 1123 97 %     Weight 11/14/17 1123 260 lb (117.9 kg)     Height 11/14/17 1123 5\' 3"  (1.6 m)     Head Circumference --      Peak Flow --      Pain Score 11/14/17 1124 10     Pain Loc --      Pain Edu? --      Excl. in GC? --     Constitutional: Alert and oriented. Well appearing and in no acute distress. Eyes: Conjunctivae are normal.  Head: Atraumatic.  Normal TMs bilaterally. Nose: Minimal clear rhinorrhea bilaterally. Mouth/Throat: Mucous membranes are moist.  No tonsillar swelling or exudate.   Neck: No stridor.   Cardiovascular: Normal rate, regular rhythm. Grossly normal heart sounds.  Mild tenderness to palpation across the anterior lower chest wall without any crepitus. Respiratory: Normal respiratory effort.  No retractions. Lungs CTAB. Mildly prolonged expiratory phase with expiratory cough. Gastrointestinal: Soft and nontender.  No distention. Musculoskeletal: No lower extremity tenderness nor edema.  No joint effusions. Neurologic:  Normal speech and language. No gross focal neurologic deficits are appreciated. Skin:  Skin is warm, dry and intact. No rash noted. Psychiatric: Mood and affect are normal. Speech and behavior are normal.  ____________________________________________   LABS (all labs ordered are listed, but only abnormal results are displayed)  Labs Reviewed  BASIC METABOLIC PANEL - Abnormal; Notable for the following components:      Result Value   CO2 20 (*)    Glucose, Bld 105 (*)    All other components  within normal limits  CBC  TROPONIN I  POC URINE PREG, ED   ____________________________________________  EKG  ED ECG REPORT I, Arelia Longestavid M Schaevitz, the attending physician, personally viewed and interpreted this ECG.   Date: 11/14/2017  EKG Time: 1130  Rate: 81  Rhythm: normal sinus rhythm  Axis: Normal  Intervals:none  ST&T Change: No ST segment elevation or depression.  No abnormal T wave inversion.  ____________________________________________  RADIOLOGY  Normal ____________________________________________   PROCEDURES  Procedure(s) performed:   Procedures  Critical Care performed:   ____________________________________________   INITIAL IMPRESSION / ASSESSMENT AND PLAN / ED COURSE  Pertinent labs & imaging results that were available during my care of the patient were reviewed by me and considered in my medical decision making (see chart for details).  Differential diagnosis includes, but is not limited to, ACS, aortic dissection, pulmonary embolism, cardiac tamponade, pneumothorax, pneumonia, pericarditis, myocarditis, GI-related causes including esophagitis/gastritis, and musculoskeletal chest wall pain.   As part of my medical decision making, I reviewed the following data within the electronic MEDICAL RECORD NUMBER Notes from prior ED visits  ----------------------------------------- 2:51 PM on 11/14/2017 -----------------------------------------  Patient at this time says that she feels greatly improved.  Lungs are clear and no longer has prolonged expiratory phase.  No expiratory cough.  Patient will be discharged with an inhaler.  Likely viral URI.  No fever.  Unlikely to be fluid already 5 days out so utility of Tamiflu at this point would be very limited.  Also advised patient to take ibuprofen for chest discomfort which is likely chest wall pain secondary to coughing.  Unlikely to be PE or other serious cause of chest pain.  Patient is understanding of the  diagnosis and willing to comply with treatment plan. ____________________________________________   FINAL CLINICAL IMPRESSION(S) / ED DIAGNOSES  Chest wall pain.  URI.  Asthma exacerbation.    NEW MEDICATIONS STARTED DURING THIS VISIT:  New Prescriptions   No medications on file     Note:  This document was prepared using Dragon voice recognition software and may include unintentional dictation errors.     Myrna BlazerSchaevitz, David Matthew, MD 11/14/17 [phone]

## 2019-07-11 IMAGING — CR DG CHEST 2V
1 series · 2 of 2 positions shown · non-contrast
Comparison: None.

CLINICAL DATA: Cough X 5 days with fever, smoker, hx of asthma

EXAM:
CHEST  2 VIEW

[Series 1: dg chest 2 view · 0.14mm/px · 2 of 2 slices shown]
[im 1/2]
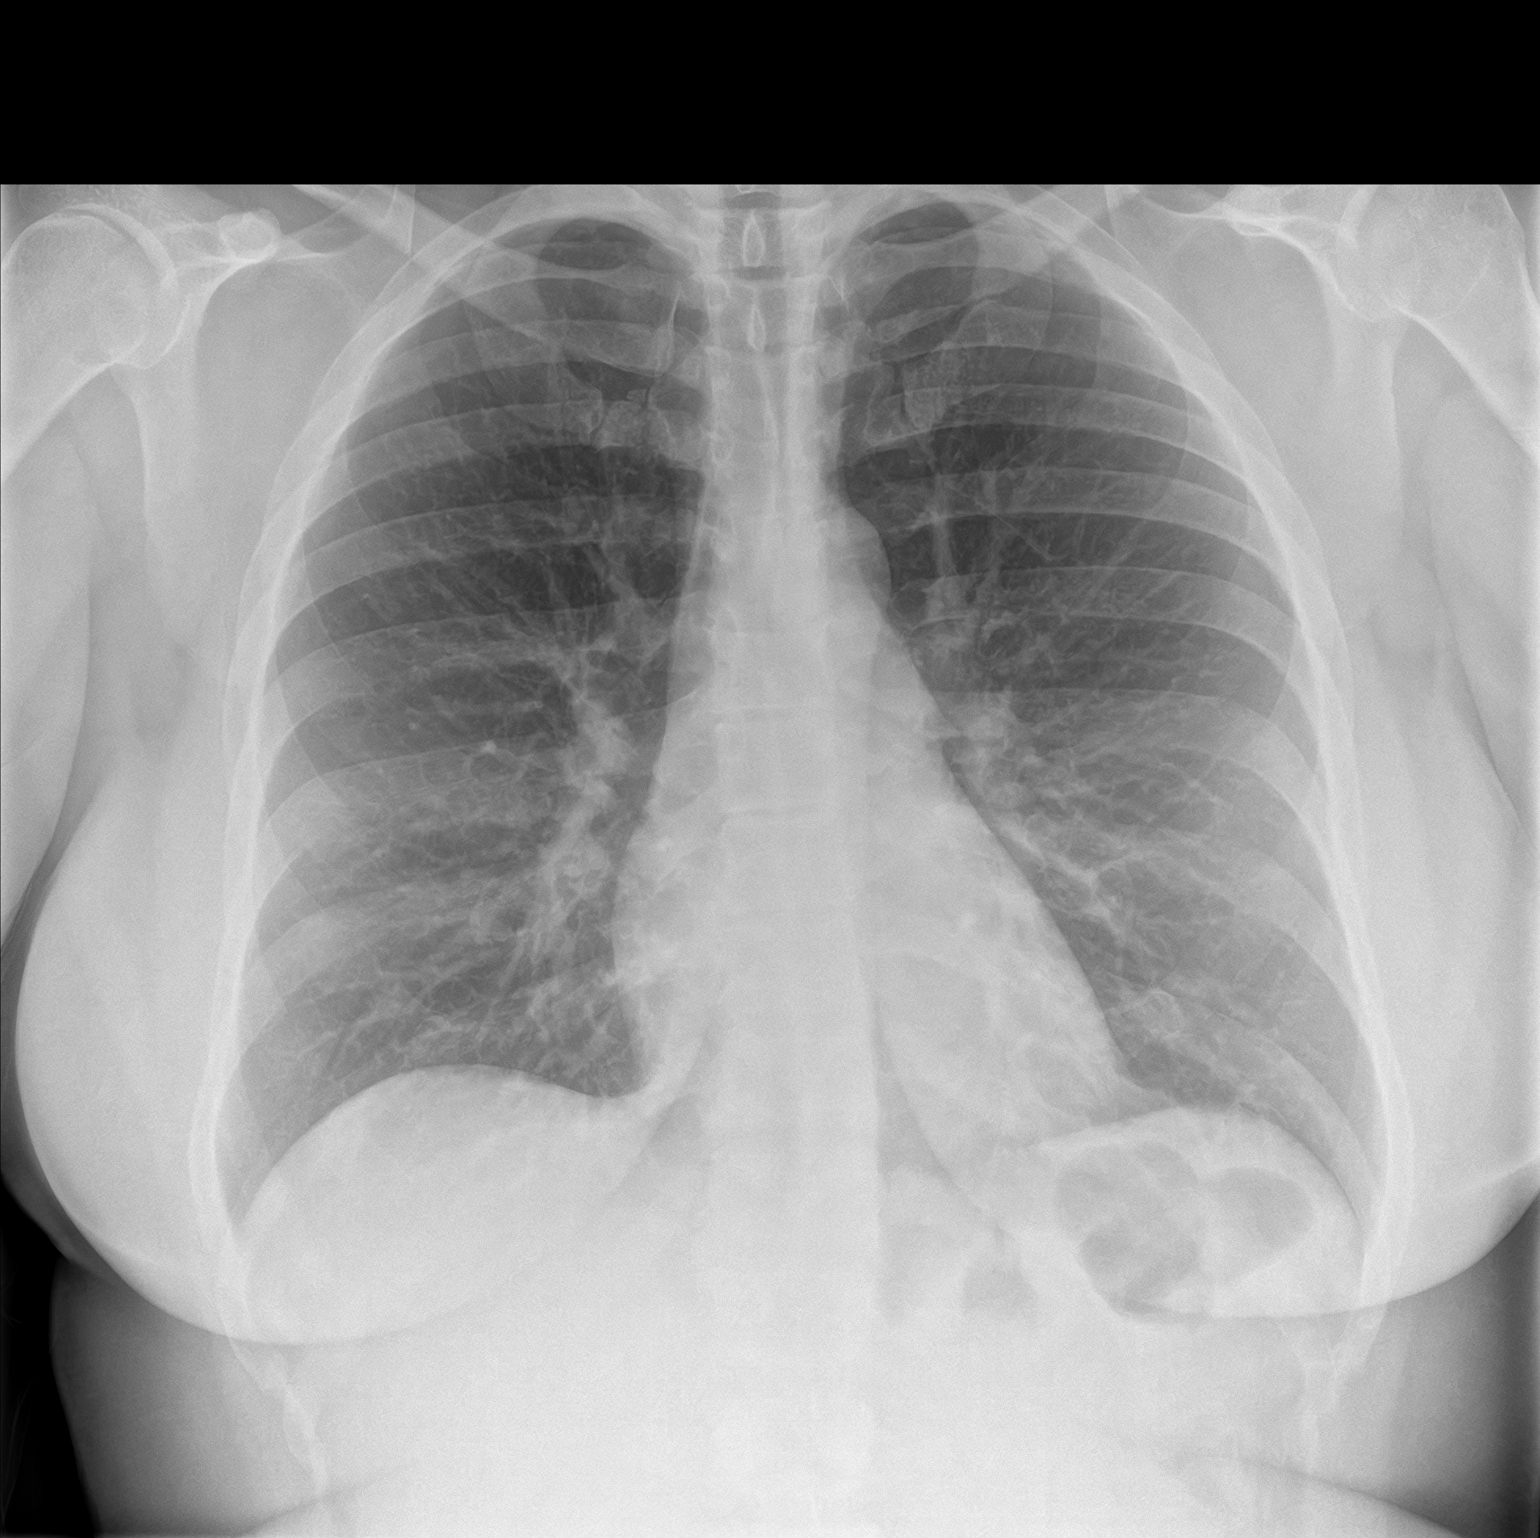
[im 2/2]
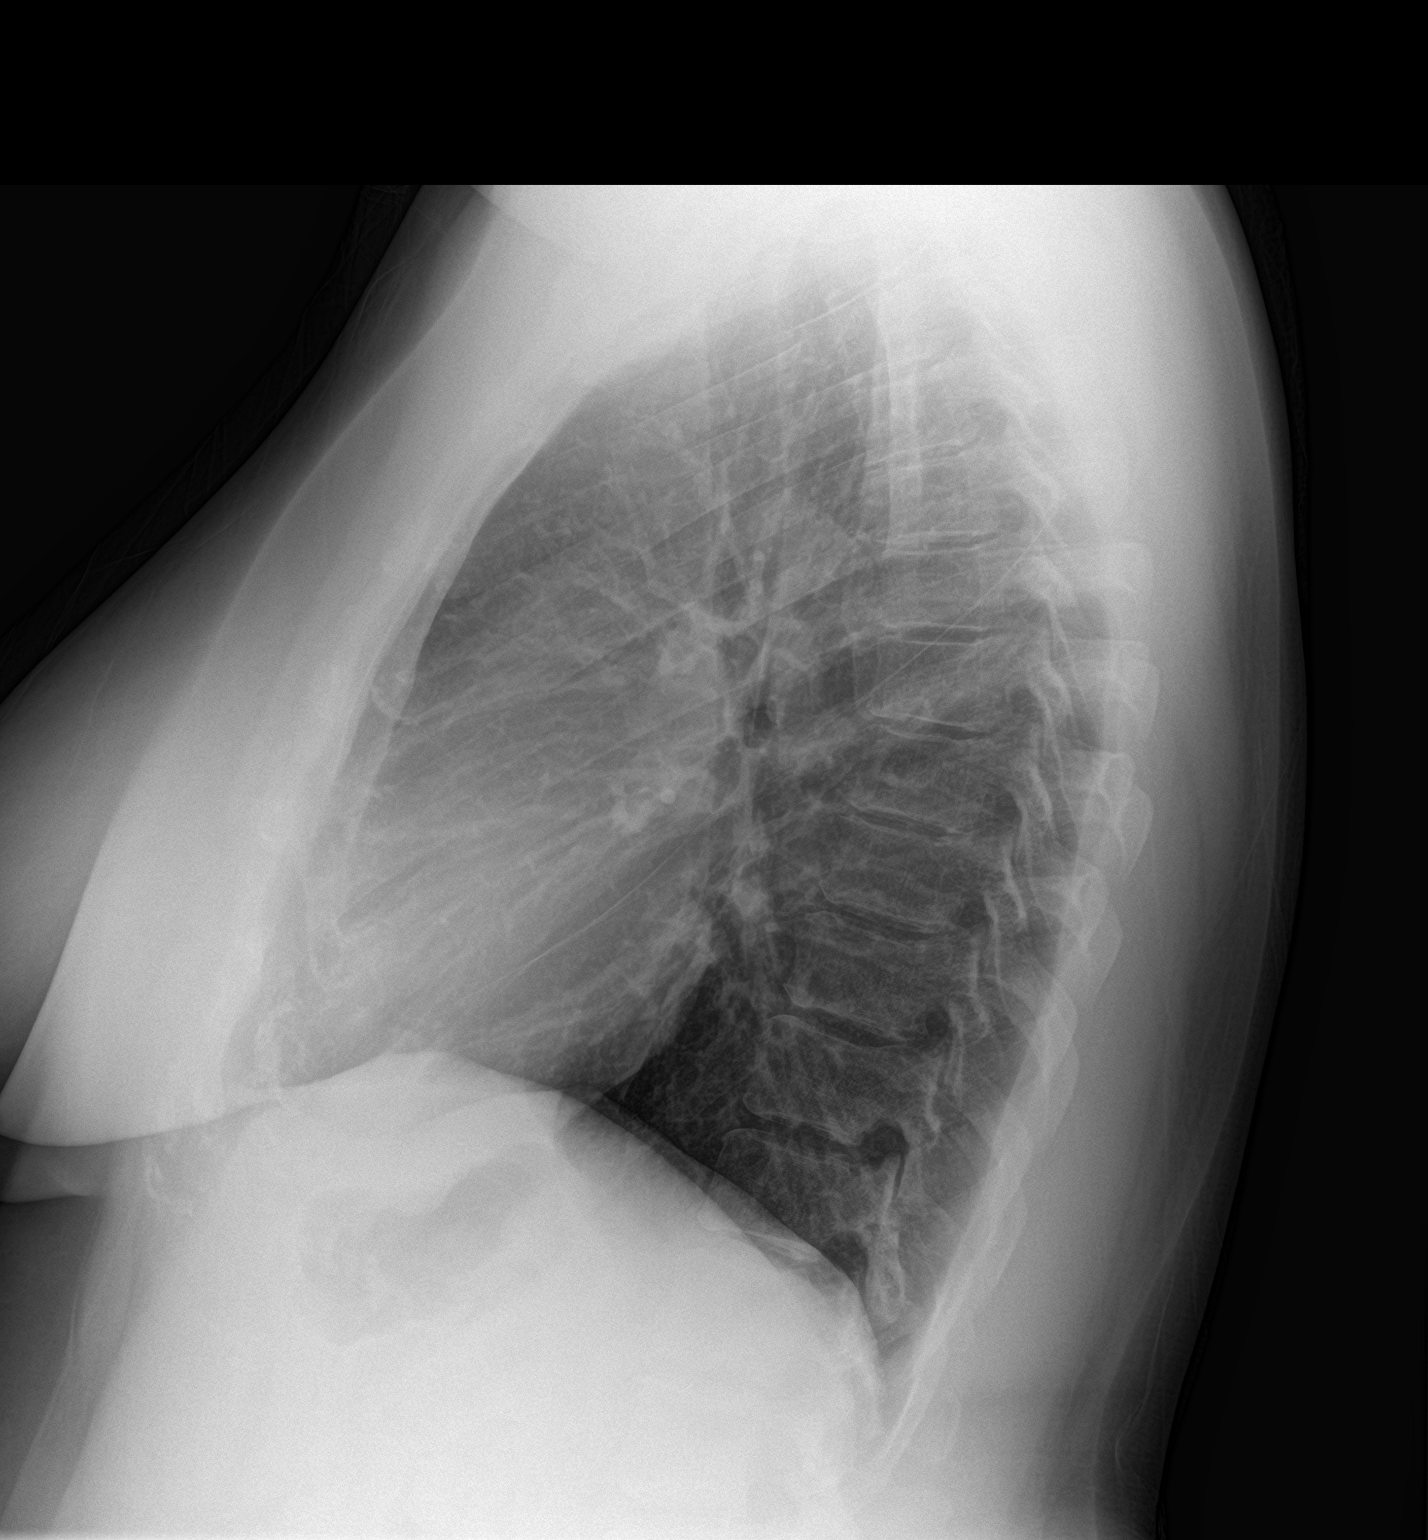

[2 of 2 positions shown; findings below may reference images not displayed]

FINDINGS: The heart size and mediastinal contours are within normal limits.
Both lungs are clear. No pleural effusion or pneumothorax. The
visualized skeletal structures are unremarkable.
IMPRESSION: No active cardiopulmonary disease.
